# Patient Record
Sex: Male | Born: 1980 | Race: Black or African American | Hispanic: No | Marital: Married | Smoking: Never smoker
Health system: Southern US, Community
[De-identification: ages and names within clinical notes are randomized; demographics above are authoritative.]

## PROBLEM LIST (undated history)

## (undated) DIAGNOSIS — Z8711 Personal history of peptic ulcer disease: Secondary | ICD-10-CM

## (undated) HISTORY — PX: APPENDECTOMY: SHX54

---

## 2021-03-08 ENCOUNTER — Encounter (HOSPITAL_COMMUNITY): Payer: Self-pay | Admitting: Emergency Medicine

## 2021-03-08 ENCOUNTER — Emergency Department (HOSPITAL_COMMUNITY): Payer: BC Managed Care – PPO

## 2021-03-08 ENCOUNTER — Other Ambulatory Visit: Payer: Self-pay

## 2021-03-08 ENCOUNTER — Emergency Department (HOSPITAL_COMMUNITY)
Admission: EM | Admit: 2021-03-08 | Discharge: 2021-03-09 | Disposition: A | Discharge status: Home / Self Care | Payer: BC Managed Care – PPO | Attending: Emergency Medicine | Admitting: Emergency Medicine

## 2021-03-08 DIAGNOSIS — R1013 Epigastric pain: Secondary | ICD-10-CM | POA: Diagnosis present

## 2021-03-08 DIAGNOSIS — N200 Calculus of kidney: Secondary | ICD-10-CM | POA: Diagnosis not present

## 2021-03-08 DIAGNOSIS — N3001 Acute cystitis with hematuria: Secondary | ICD-10-CM | POA: Insufficient documentation

## 2021-03-08 HISTORY — DX: Personal history of peptic ulcer disease: Z87.11

## 2021-03-08 LAB — URINALYSIS, MICROSCOPIC (REFLEX)
RBC / HPF: >50 RBC/hpf (ref 0–5)
Squamous Epithelial / HPF: NONE SEEN (ref 0–5)

## 2021-03-08 LAB — URINALYSIS, ROUTINE W REFLEX MICROSCOPIC
Glucose, UA: NEGATIVE mg/dL
Ketones, ur: 40 mg/dL — AB
Leukocytes,Ua: NEGATIVE
Nitrite: POSITIVE — AB
Protein, ur: 30 mg/dL — AB
Specific Gravity, Urine: >1.03 — ABNORMAL HIGH (ref 1.005–1.030)
pH: 5.5 (ref 5.0–8.0)

## 2021-03-08 LAB — COMPREHENSIVE METABOLIC PANEL
ALT: 15 U/L (ref 0–44)
AST: 23 U/L (ref 15–41)
Albumin: 3.6 g/dL (ref 3.5–5.0)
Alkaline Phosphatase: 71 U/L (ref 38–126)
Anion gap: 6 (ref 5–15)
BUN: 9 mg/dL (ref 6–20)
CO2: 21 mmol/L — ABNORMAL LOW (ref 22–32)
Calcium: 8.7 mg/dL — ABNORMAL LOW (ref 8.9–10.3)
Chloride: 108 mmol/L (ref 98–111)
Creatinine, Ser: 0.8 mg/dL (ref 0.61–1.24)
GFR, Estimated: >60 mL/min (ref >60)
Glucose, Bld: 98 mg/dL (ref 70–99)
Potassium: 3.8 mmol/L (ref 3.5–5.1)
Sodium: 135 mmol/L (ref 135–145)
Total Bilirubin: 1.3 mg/dL — ABNORMAL HIGH (ref 0.3–1.2)
Total Protein: 6.9 g/dL (ref 6.5–8.1)

## 2021-03-08 LAB — CBC
HCT: 41.2 % (ref 39.0–52.0)
Hemoglobin: 14.8 g/dL (ref 13.0–17.0)
MCH: 34.2 pg — ABNORMAL HIGH (ref 26.0–34.0)
MCHC: 35.9 g/dL (ref 30.0–36.0)
MCV: 95.2 fL (ref 80.0–100.0)
Platelets: 153 10*3/uL (ref 150–400)
RBC: 4.33 MIL/uL (ref 4.22–5.81)
RDW: 13.2 % (ref 11.5–15.5)
WBC: 7.3 10*3/uL (ref 4.0–10.5)
nRBC: 0 % (ref 0.0–0.2)

## 2021-03-08 LAB — LIPASE, BLOOD: Lipase: 31 U/L (ref 11–51)

## 2021-03-08 MED ORDER — OXYCODONE-ACETAMINOPHEN 5-325 MG PO TABS
1.0000 | ORAL_TABLET | Freq: Once | ORAL | Status: AC
  Administered 2021-03-08: 1 via ORAL
  Filled 2021-03-08: qty 1

## 2021-03-08 NOTE — ED Triage Notes (Signed)
Patient reports bilateral flank pain onset of today while at work. Reports dark urination earlier today. Denies any dysuria. States pain has been moving around- lower abdomen, left testicle and lower back.

## 2021-03-08 NOTE — ED Provider Triage Note (Signed)
Emergency Medicine Provider Triage Evaluation Note  Eric Ray , a 41 y.o. male  was evaluated in triage.  Pt complains of bilateral flank pain that started today.  Patient notes 1 episode of dark urine earlier.  To that his pain has been moving around to his lower abdomen, his left testicle.  Review of Systems  Positive: Flank pain, back pain, testicular pain, abdominal pain, nausea, dark urine Negative: Fever, chills, vomiting, dysuria, diarrhea  Physical Exam  BP 136/70 (BP Location: Right Arm)    Pulse 89    Temp 98.1 F (36.7 C) (Oral)    Resp 18    Ht 6\' 2"  (1.88 m)    Wt 90.7 kg    SpO2 100%    BMI 25.68 kg/m  Gen:   Awake, no distress   Resp:  Normal effort  MSK:   Moves extremities without difficulty  Other:  Patient appears uncomfortable  Medical Decision Making  Medically screening exam initiated at 6:06 PM.  Appropriate orders placed.  was informed that the remainder of the evaluation will be completed by another provider, this initial triage assessment does not replace that evaluation, and the importance of remaining in the ED until their evaluation is complete.     Mairi Stagliano T, PA-C 03/08/21 1807

## 2021-03-09 MED ORDER — HYDROCODONE-ACETAMINOPHEN 5-325 MG PO TABS
1.0000 | ORAL_TABLET | Freq: Four times a day (QID) | ORAL | 0 refills | Status: DC | PRN
Start: 2021-03-09 — End: 2021-03-09

## 2021-03-09 MED ORDER — HYDROCODONE-ACETAMINOPHEN 5-325 MG PO TABS: 1.0000 | ORAL_TABLET | Freq: Four times a day (QID) | ORAL | 0 refills | Status: DC | PRN

## 2021-03-09 MED ORDER — CEPHALEXIN 500 MG PO CAPS: 500.0000 mg | ORAL_CAPSULE | Freq: Two times a day (BID) | ORAL | 0 refills | Status: AC

## 2021-03-09 MED ORDER — HYDROCODONE-ACETAMINOPHEN 5-325 MG PO TABS
1.0000 | ORAL_TABLET | Freq: Four times a day (QID) | ORAL | 0 refills | Status: AC | PRN
Start: 2021-03-09 — End: ?

## 2021-03-09 MED ORDER — ONDANSETRON 4 MG PO TBDP
ORAL_TABLET | ORAL | 0 refills | Status: AC
Start: 2021-03-09 — End: ?

## 2021-03-09 NOTE — Discharge Instructions (Addendum)
Take Zofran as needed for nausea and vomiting. Take 1000 mg of Tylenol every 4 hrs for pain. For severe pain take norco or vicodin however realize they have the potential for addiction and it can make you sleepy and has tylenol in it.  No operating machinery while taking.  Take antibiotics as prescribed and follow-up urine culture result with urology or primary doctor in 2 to 3 days. Return for persistent vomiting, uncontrolled pain, persistent fevers or new concerns.

## 2021-03-09 NOTE — ED Provider Notes (Signed)
[facility] EMERGENCY DEPARTMENT Provider Note   CSN: [id number] Arrival date & time: 03/08/21  1711     History  Chief Complaint  Patient presents with   Flank Pain    Eric Ray is a 41 y.o. male.  Patient presents with epigastric pain and bilateral flank pain worsening since yesterday.  Started off with upper abdominal pain became more in the right flank area and mild radiation to the testicle.  Patient is told he has a kidney stone is in his kidney but never passed on.  Patient has seen a urologist in the past.  No fevers chills or vomiting.  Mild nausea.  Dark urine today.  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  Symptoms intermittent.  History of ulcer.      Home Medications Prior to Admission medications   Medication Sig Start Date End Date Taking? Authorizing Provider  cephALEXin (KEFLEX) 500 MG capsule Take 1 capsule (500 mg total) by mouth 2 (two) times daily. 03/09/21  Yes Elnora Morrison, MD  HYDROcodone-acetaminophen (NORCO/VICODIN) 5-325 MG tablet Take 1-2 tablets by mouth every 6 (six) hours as needed for severe pain. 03/09/21  Yes Elnora Morrison, MD  ondansetron (ZOFRAN-ODT) 4 MG disintegrating tablet 4mg  ODT q4 hours prn nausea/vomit 03/09/21  Yes Elnora Morrison, MD      Allergies    Patient has no known allergies.    Review of Systems   Review of Systems  Constitutional:  Negative for chills and fever.  HENT:  Negative for congestion.   Eyes:  Negative for visual disturbance.  Respiratory:  Negative for shortness of breath.   Cardiovascular:  Negative for chest pain.  Gastrointestinal:  Positive for abdominal pain and nausea. Negative for vomiting.  Genitourinary:  Positive for flank pain. Negative for dysuria.  Musculoskeletal:  Negative for back pain, neck pain and neck stiffness.  Skin:  Negative for rash.  Neurological:  Negative for light-headedness and headaches.   Physical Exam Updated Vital Signs BP 136/71 (BP  Location: Right Arm)    Pulse 62    Temp 98.1 F (36.7 C) (Oral)    Resp 15    Ht 6\' 2"  (1.88 m)    Wt 90.7 kg    SpO2 100%    BMI 25.68 kg/m  Physical Exam Vitals and nursing note reviewed.  Constitutional:      General: He is not in acute distress.    Appearance: He is well-developed.  HENT:     Head: Normocephalic and atraumatic.     Mouth/Throat:     Mouth: Mucous membranes are moist.  Eyes:     General:        Right eye: No discharge.        Left eye: No discharge.     Conjunctiva/sclera: Conjunctivae normal.  Neck:     Trachea: No tracheal deviation.  Cardiovascular:     Rate and Rhythm: Normal rate and regular rhythm.     Heart sounds: No murmur heard. Pulmonary:     Effort: Pulmonary effort is normal.     Breath sounds: Normal breath sounds.  Abdominal:     General: There is no distension.     Palpations: Abdomen is soft.     Tenderness: There is no abdominal tenderness. There is no guarding.  Musculoskeletal:        General: No swelling.     Cervical back: Normal range of motion and neck supple. No rigidity.  Skin:    General:  Skin is warm.     Capillary Refill: Capillary refill takes less than 2 seconds.     Findings: No rash.  Neurological:     General: No focal deficit present.     Mental Status: He is alert.     Cranial Nerves: No cranial nerve deficit.  Psychiatric:        Mood and Affect: Mood normal.    ED Results / Procedures / Treatments   Labs (all labs ordered are listed, but only abnormal results are displayed) Labs Reviewed  COMPREHENSIVE METABOLIC PANEL - Abnormal; Notable for the following components:      Result Value   CO2 21 (*)    Calcium 8.7 (*)    Total Bilirubin 1.3 (*)    All other components within normal limits  CBC - Abnormal; Notable for the following components:   MCH 34.2 (*)    All other components within normal limits  URINALYSIS, ROUTINE W REFLEX MICROSCOPIC - Abnormal; Notable for the following components:   Specific  Gravity, Urine >1.030 (*)    Hgb urine dipstick LARGE (*)    Bilirubin Urine SMALL (*)    Ketones, ur 40 (*)    Protein, ur 30 (*)    Nitrite POSITIVE (*)    All other components within normal limits  URINALYSIS, MICROSCOPIC (REFLEX) - Abnormal; Notable for the following components:   Bacteria, UA RARE (*)    All other components within normal limits  URINE CULTURE  LIPASE, BLOOD    EKG None  Radiology CT Renal Stone Study  Result Date: 03/08/2021 CLINICAL DATA:  Flank pain, kidney stone suspected EXAM: CT ABDOMEN AND PELVIS WITHOUT CONTRAST TECHNIQUE: Multidetector CT imaging of the abdomen and pelvis was performed following the standard protocol without IV contrast. RADIATION DOSE REDUCTION: This exam was performed according to the departmental dose-optimization program which includes automated exposure control, adjustment of the mA and/or kV according to patient size and/or use of iterative reconstruction technique. COMPARISON:  None. FINDINGS: Lower chest: Lingular atelectasis. Hepatobiliary: No focal liver abnormality. No gallstones, gallbladder wall thickening, or pericholecystic fluid. No biliary dilatation. Pancreas: No focal lesion. Normal pancreatic contour. No surrounding inflammatory changes. No main pancreatic ductal dilatation. Spleen: Normal in size without focal abnormality. Adrenals/Urinary Tract: No adrenal nodule bilaterally. Punctate right nephrolithiasis. There is a 2 mm calcified stone in the region of the expected proximal right ureter. Ureter distally is normal in caliber. No left hydroureteronephrosis. No left nephroureterolithiasis. The urinary bladder is unremarkable. Stomach/Bowel: Stomach is within normal limits. No evidence of bowel wall thickening or dilatation. Appendix appears normal. Vascular/Lymphatic: No abdominal aorta or iliac aneurysm. Mild atherosclerotic plaque of the aorta and its branches. No abdominal, pelvic, or inguinal lymphadenopathy. Reproductive:  Prostate is unremarkable. Other: No intraperitoneal free fluid. No intraperitoneal free gas. No organized fluid collection. Musculoskeletal: No abdominal wall hernia or abnormality. No suspicious lytic or blastic osseous lesions. No acute displaced fracture. IMPRESSION: 1. Findings suggestive of a early/minimally obstructive 2 mm proximal right ureterolithiasis. 2. Nonobstructive punctate right nephrolithiasis. Electronically Signed   By: Iven Finn M.D.   On: 03/08/2021 22:18    Procedures Procedures    Medications Ordered in ED Medications  oxyCODONE-acetaminophen (PERCOCET/ROXICET) 5-325 MG per tablet 1 tablet (1 tablet Oral Given 03/08/21 1808)    ED Course/ Medical Decision Making/ A&P                           Medical Decision  Making Amount and/or Complexity of Data Reviewed Labs: ordered.  Risk Prescription drug management.   Patient presents with intermittent flank pain and dark urine with clinical concern for kidney stone and possibly urine/kidney infection.  Other differentials include musculoskeletal, reflux, ulcer, other.  Blood work ordered and reviewed showing normal white blood cell count, normal hemoglobin, electrolytes unremarkable.  Urinalysis showed positive nitrate, hemoglobin.  Pain controlled on assessment after oral medicines. Discussed importance of follow-up of urine culture, reassessment by urology for which he already has urologist and reasons to return.  Prescriptions for Zofran, Norco as needed and Keflex.  Patient stable for discharge and wife with patient during discussion. Work note given.        Final Clinical Impression(s) / ED Diagnoses Final diagnoses:  Right kidney stone  Acute cystitis with hematuria    Rx / DC Orders ED Discharge Orders          Ordered    ondansetron (ZOFRAN-ODT) 4 MG disintegrating tablet        03/09/21 0841    HYDROcodone-acetaminophen (NORCO/VICODIN) 5-325 MG tablet  Every 6 hours PRN        03/09/21 0841     cephALEXin (KEFLEX) 500 MG capsule  2 times daily        03/09/21 0841              Elnora Morrison, MD 03/09/21 [phone]

## 2021-03-10 LAB — URINE CULTURE: Culture: <10000 — AB

## 2023-01-23 IMAGING — CT CT RENAL STONE PROTOCOL
2 of 4 series · 16 of 46 positions shown, 18 images · non-contrast
Comparison: None.

CLINICAL DATA: Flank pain, kidney stone suspected



[Series 3: ap without · axial · non-contrast · 0.85mm/px · z∈[-324,+111]mm · 13 of 99 slices shown, 15 images]
[im 6/99  soft-tissue]
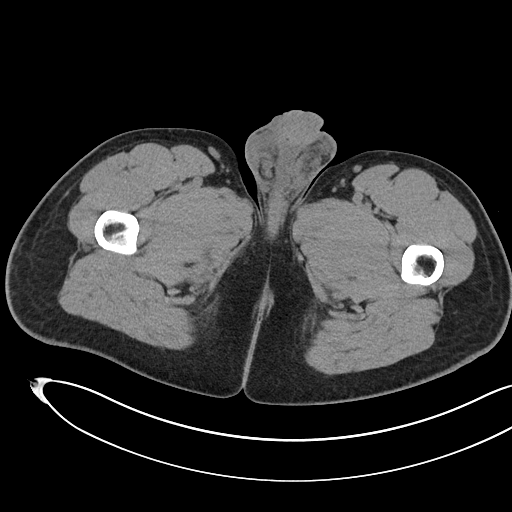
[im 6/99  bone]
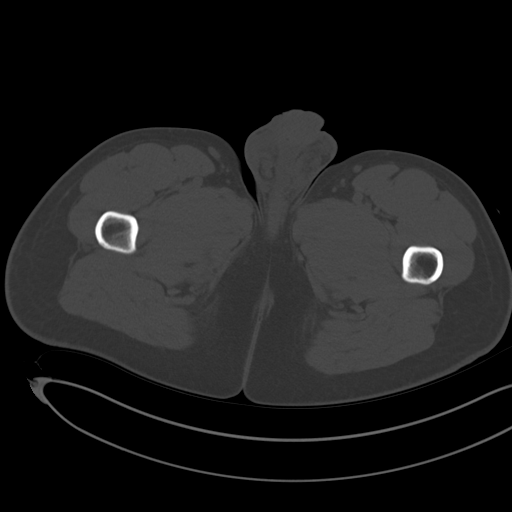
[im 12/99  soft-tissue]
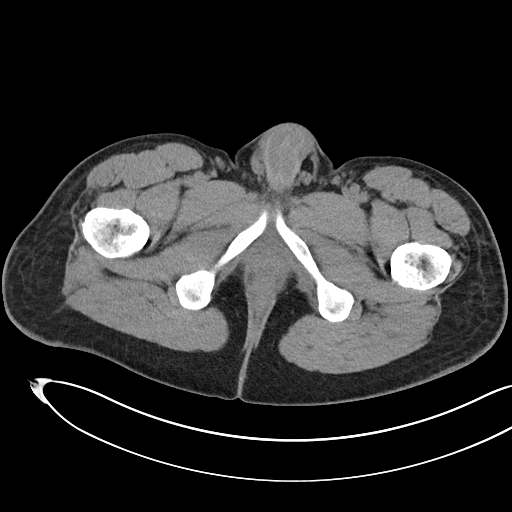
[im 24/99  soft-tissue]
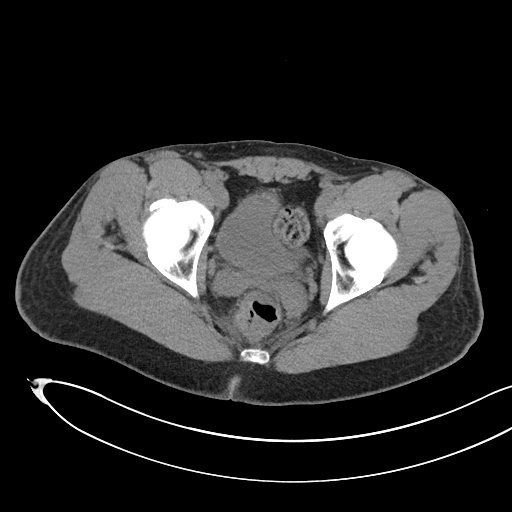
[im 29/99  soft-tissue]
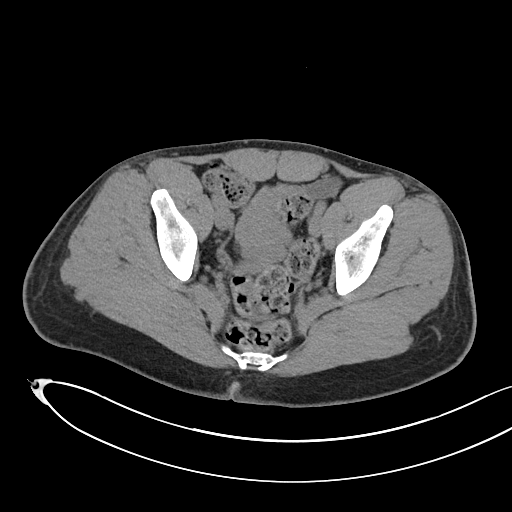
[im 35/99  soft-tissue]
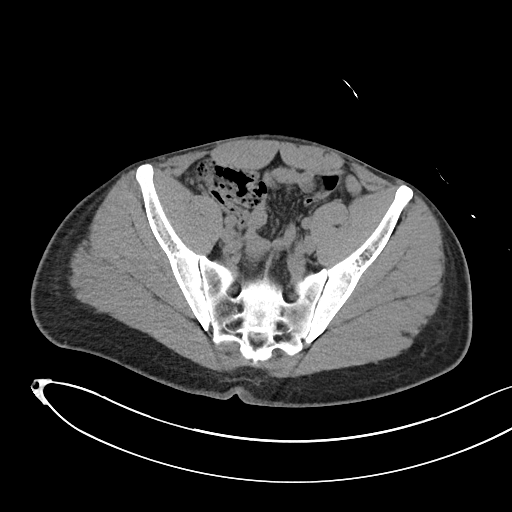
[im 41/99  soft-tissue]
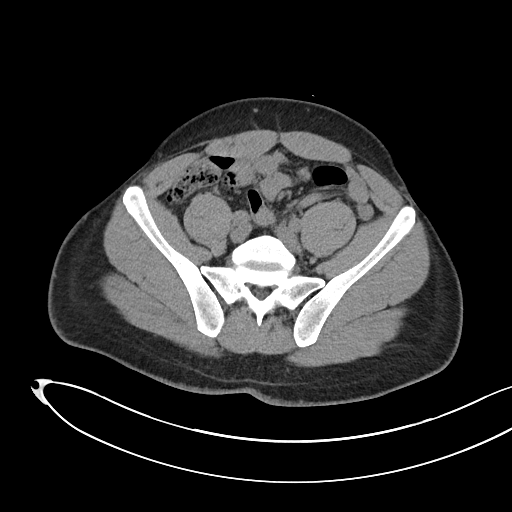
[im 52/99  soft-tissue]
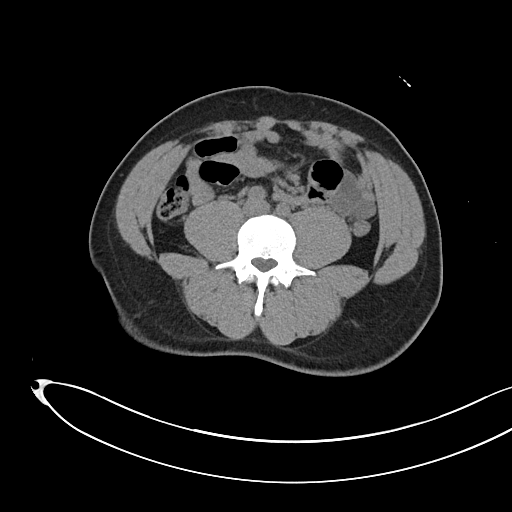
[im 58/99  soft-tissue]
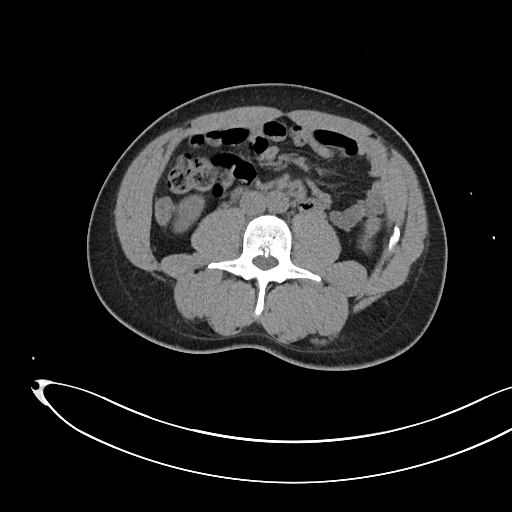
[im 64/99  soft-tissue]
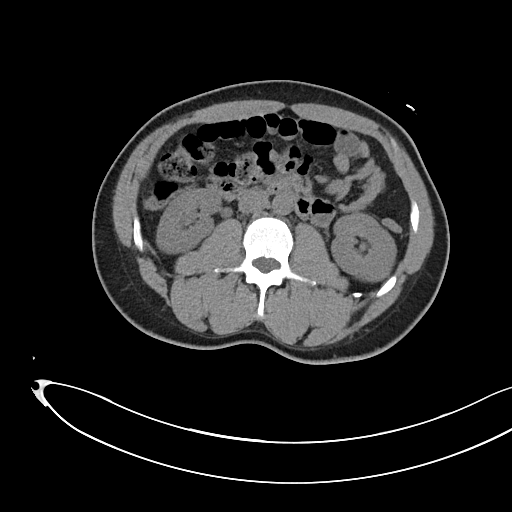
[im 64/99  bone]
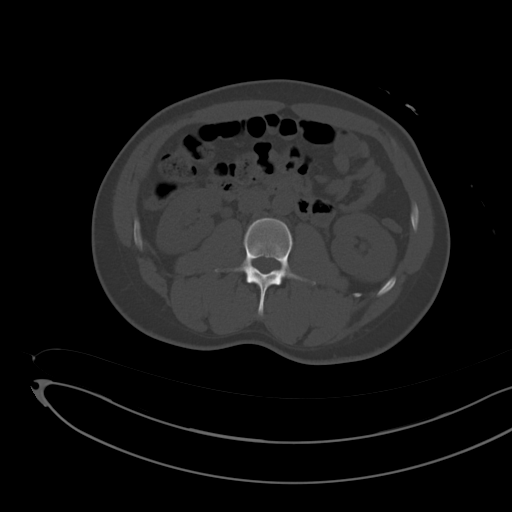
[im 70/99  soft-tissue]
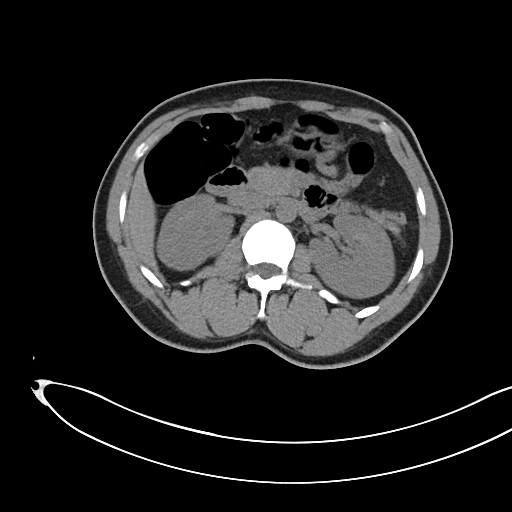
[im 75/99  soft-tissue]
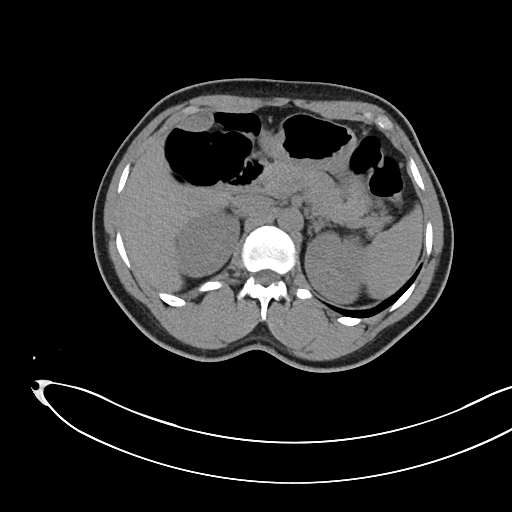
[im 87/99  soft-tissue]
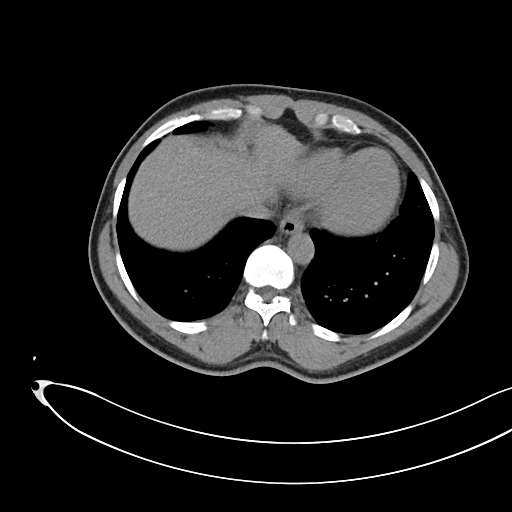
[im 93/99  soft-tissue]
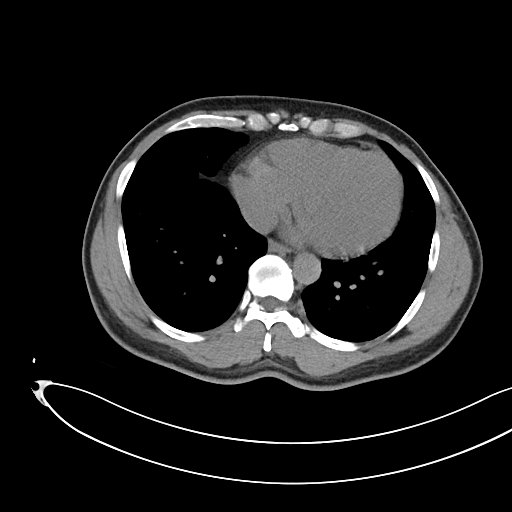

[Series 6: cor · coronal · 0.84mm/px · 3 of 94 slices shown]
[im 32/94  soft-tissue]
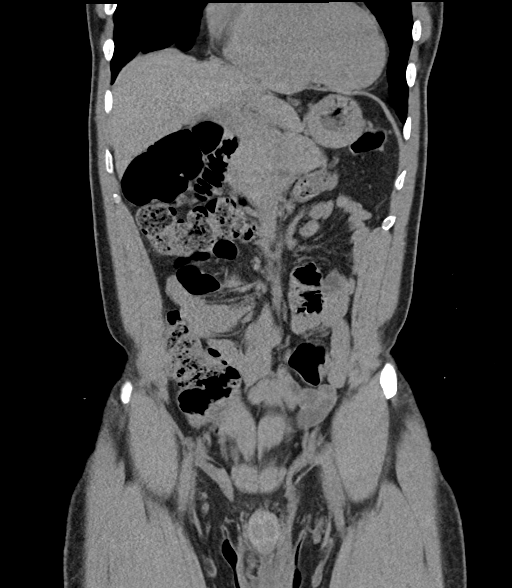
[im 42/94  soft-tissue]
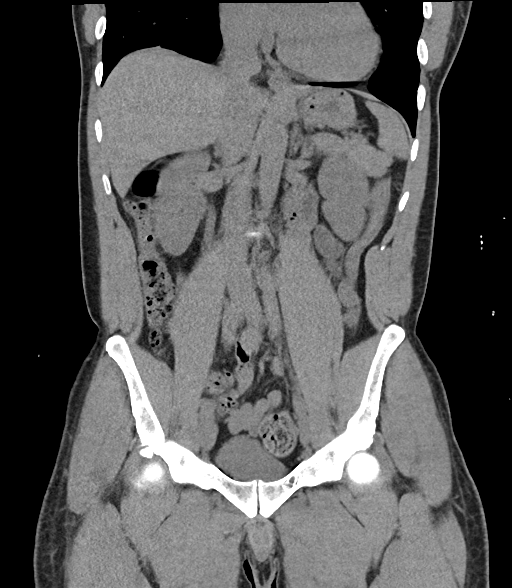
[im 52/94  soft-tissue]
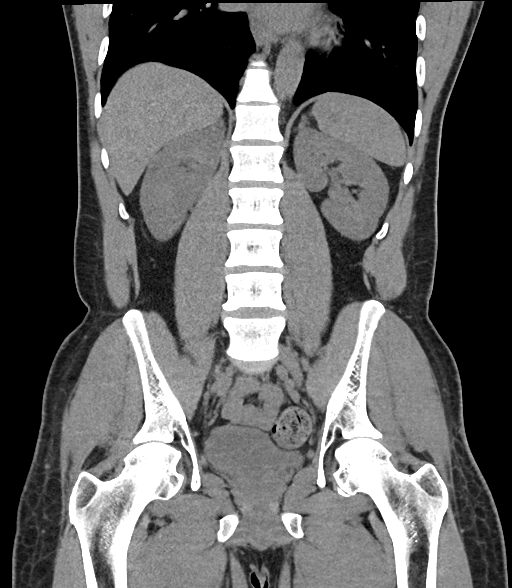

[16 of 46 positions shown; findings below may reference images not displayed]

FINDINGS: Lower chest: Lingular atelectasis.

Hepatobiliary: No focal liver abnormality. No gallstones,
gallbladder wall thickening, or pericholecystic fluid. No biliary
dilatation.

Pancreas: No focal lesion. Normal pancreatic contour. No surrounding
inflammatory changes. No main pancreatic ductal dilatation.

Spleen: Normal in size without focal abnormality.

Adrenals/Urinary Tract:

No adrenal nodule bilaterally.

Punctate right nephrolithiasis. There is a 2 mm calcified stone in
the region of the expected proximal right ureter. Ureter distally is
normal in caliber. No left hydroureteronephrosis. No left
nephroureterolithiasis.

The urinary bladder is unremarkable.

Stomach/Bowel: Stomach is within normal limits. No evidence of bowel
wall thickening or dilatation. Appendix appears normal.

Vascular/Lymphatic: No abdominal aorta or iliac aneurysm. Mild
atherosclerotic plaque of the aorta and its branches. No abdominal,
pelvic, or inguinal lymphadenopathy.

Reproductive: Prostate is unremarkable.

Other: No intraperitoneal free fluid. No intraperitoneal free gas.
No organized fluid collection.

Musculoskeletal:

No abdominal wall hernia or abnormality.

No suspicious lytic or blastic osseous lesions. No acute displaced
fracture.
IMPRESSION: 1. Findings suggestive of a early/minimally obstructive 2 mm
proximal right ureterolithiasis.
2. Nonobstructive punctate right nephrolithiasis.
